# Patient Record
Sex: Male | Born: 1985 | Hispanic: Yes | Marital: Married | Smoking: Never smoker
Health system: Southern US, Community
[De-identification: ages and names within clinical notes are randomized; demographics above are authoritative.]

---

## 2016-10-16 ENCOUNTER — Emergency Department (HOSPITAL_COMMUNITY): Payer: Self-pay

## 2016-10-16 ENCOUNTER — Encounter (HOSPITAL_COMMUNITY): Payer: Self-pay

## 2016-10-16 ENCOUNTER — Emergency Department (HOSPITAL_COMMUNITY)
Admission: EM | Admit: 2016-10-16 | Discharge: 2016-10-16 | Disposition: A | Discharge status: Home / Self Care | Payer: Self-pay | Attending: Emergency Medicine | Admitting: Emergency Medicine

## 2016-10-16 DIAGNOSIS — J189 Pneumonia, unspecified organism: Secondary | ICD-10-CM | POA: Insufficient documentation

## 2016-10-16 DIAGNOSIS — R739 Hyperglycemia, unspecified: Secondary | ICD-10-CM | POA: Insufficient documentation

## 2016-10-16 DIAGNOSIS — J181 Lobar pneumonia, unspecified organism: Secondary | ICD-10-CM

## 2016-10-16 LAB — URINALYSIS, ROUTINE W REFLEX MICROSCOPIC
BILIRUBIN URINE: NEGATIVE
Bacteria, UA: NONE SEEN
GLUCOSE, UA: 50 mg/dL — AB
KETONES UR: NEGATIVE mg/dL
LEUKOCYTES UA: NEGATIVE
NITRITE: NEGATIVE
PH: 7 (ref 5.0–8.0)
PROTEIN: NEGATIVE mg/dL
SQUAMOUS EPITHELIAL / LPF: NONE SEEN
Specific Gravity, Urine: 1.01 (ref 1.005–1.030)
WBC, UA: NONE SEEN WBC/hpf (ref 0–5)

## 2016-10-16 LAB — COMPREHENSIVE METABOLIC PANEL
ALT: 31 U/L (ref 17–63)
ANION GAP: 8 (ref 5–15)
AST: 24 U/L (ref 15–41)
Albumin: 3.7 g/dL (ref 3.5–5.0)
Alkaline Phosphatase: 63 U/L (ref 38–126)
BILIRUBIN TOTAL: 1 mg/dL (ref 0.3–1.2)
BUN: 5 mg/dL — ABNORMAL LOW (ref 6–20)
CALCIUM: 8.7 mg/dL — AB (ref 8.9–10.3)
CO2: 22 mmol/L (ref 22–32)
Chloride: 104 mmol/L (ref 101–111)
Creatinine, Ser: 0.77 mg/dL (ref 0.61–1.24)
GFR calc Af Amer: >60 mL/min (ref >60)
Glucose, Bld: 150 mg/dL — ABNORMAL HIGH (ref 65–99)
POTASSIUM: 3.6 mmol/L (ref 3.5–5.1)
Sodium: 134 mmol/L — ABNORMAL LOW (ref 135–145)
TOTAL PROTEIN: 7.1 g/dL (ref 6.5–8.1)

## 2016-10-16 LAB — CBC WITH DIFFERENTIAL/PLATELET
BASOS ABS: 0 10*3/uL (ref 0.0–0.1)
BASOS PCT: 0 %
EOS PCT: 1 %
Eosinophils Absolute: 0.1 10*3/uL (ref 0.0–0.7)
HEMATOCRIT: 45.2 % (ref 39.0–52.0)
Hemoglobin: 14.9 g/dL (ref 13.0–17.0)
LYMPHS PCT: 9 %
Lymphs Abs: 1.3 10*3/uL (ref 0.7–4.0)
MCH: 28.7 pg (ref 26.0–34.0)
MCHC: 33 g/dL (ref 30.0–36.0)
MCV: 87.1 fL (ref 78.0–100.0)
Monocytes Absolute: 1.2 10*3/uL — ABNORMAL HIGH (ref 0.1–1.0)
Monocytes Relative: 9 %
NEUTROS ABS: 11.6 10*3/uL — AB (ref 1.7–7.7)
Neutrophils Relative %: 81 %
PLATELETS: 176 10*3/uL (ref 150–400)
RBC: 5.19 MIL/uL (ref 4.22–5.81)
RDW: 13.3 % (ref 11.5–15.5)
WBC: 14.2 10*3/uL — AB (ref 4.0–10.5)

## 2016-10-16 LAB — I-STAT CG4 LACTIC ACID, ED: Lactic Acid, Venous: 1.96 mmol/L — ABNORMAL HIGH (ref 0.5–1.9)

## 2016-10-16 MED ORDER — ACETAMINOPHEN 325 MG PO TABS
650.0000 mg | ORAL_TABLET | Freq: Once | ORAL | Status: AC | PRN
  Administered 2016-10-16: 650 mg via ORAL

## 2016-10-16 MED ORDER — ACETAMINOPHEN 325 MG PO TABS
ORAL_TABLET | ORAL | Status: AC
  Filled 2016-10-16: qty 2

## 2016-10-16 MED ORDER — AZITHROMYCIN 250 MG PO TABS
500.0000 mg | ORAL_TABLET | Freq: Once | ORAL | Status: AC
  Administered 2016-10-16: 500 mg via ORAL
  Filled 2016-10-16: qty 2

## 2016-10-16 MED ORDER — AZITHROMYCIN 250 MG PO TABS: 250.0000 mg | ORAL_TABLET | Freq: Every day | ORAL | 0 refills | Status: AC

## 2016-10-16 NOTE — ED Provider Notes (Signed)
[facility]-EMERGENCY DEPT Provider Note   CSN: [id number] Arrival date & time: 10/16/16  1347     History   Chief Complaint No chief complaint on file.   HPI Gerald Farmer is a 31 y.o. male.  HPI   31 year old male presents today complaining of cough and fever. He states the symptoms began 5 days prior to evaluation. He began having some fever with cough. His cough has been productive of yellow sputum. He has had some diffuse body aches and headache. His sputum was blood-tinged. He denies any dyspnea. He has had a poor appetite but has been drinking fluids without difficulty. He complains of some diffuse low back pain. He denies any urinary tract infection symptoms, specifically hematuria, frequency, or dysuria. He is not a smoker. He denies any history of pneumonia in the past. He denies any history of DVT, lateralized leg swelling, or PE. He is visiting here from [place].   History reviewed. No pertinent past medical history.  There are no active problems to display for this patient.   History reviewed. No pertinent surgical history.     Home Medications    Prior to Admission medications   Not on File    Family History No family history on file.  Social History Social History  Substance Use Topics  . Smoking status: Never Smoker  . Smokeless tobacco: Never Used  . Alcohol use Not on file     Allergies   Patient has no known allergies.   Review of Systems Review of Systems  All other systems reviewed and are negative.    Physical Exam Updated Vital Signs BP (!) 145/87 (BP Location: Right Arm)   Pulse 91   Temp 97.8 F (36.6 C) (Oral)   Resp 18   SpO2 98%   Physical Exam  Constitutional: He is oriented to person, place, and time. He appears well-developed and well-nourished. No distress.  HENT:  Head: Normocephalic and atraumatic.  Right Ear: External ear normal.  Left Ear: External ear normal.  Nose: Nose normal.  Eyes: EOM are normal.  Neck: Normal  range of motion. Neck supple. No tracheal deviation present.  Cardiovascular: Normal rate, regular rhythm and normal heart sounds.   Pulmonary/Chest: Effort normal. He has rales.  Abdominal: Soft. Bowel sounds are normal.  Musculoskeletal: Normal range of motion. He exhibits no edema, tenderness or deformity.  Neurological: He is alert and oriented to person, place, and time. He displays normal reflexes. No cranial nerve deficit. Coordination normal.  Skin: Skin is warm and dry. Capillary refill takes less than 2 seconds.  Psychiatric: He has a normal mood and affect. His behavior is normal.  Nursing note and vitals reviewed.    ED Treatments / Results  Labs (all labs ordered are listed, but only abnormal results are displayed) Labs Reviewed  COMPREHENSIVE METABOLIC PANEL - Abnormal; Notable for the following:       Result Value   Sodium 134 (*)    Glucose, Bld 150 (*)    BUN 5 (*)    Calcium 8.7 (*)    All other components within normal limits  CBC WITH DIFFERENTIAL/PLATELET - Abnormal; Notable for the following:    WBC 14.2 (*)    Neutro Abs 11.6 (*)    Monocytes Absolute 1.2 (*)    All other components within normal limits  URINALYSIS, ROUTINE W REFLEX MICROSCOPIC - Abnormal; Notable for the following:    Glucose, UA 50 (*)    Hgb urine dipstick MODERATE (*)  All other components within normal limits  I-STAT CG4 LACTIC ACID, ED - Abnormal; Notable for the following:    Lactic Acid, Venous 1.96 (*)    All other components within normal limits    EKG  EKG Interpretation None       Radiology Dg Chest 2 View  Result Date: 10/16/2016 CLINICAL DATA:  31 year old with intermittent cough, fever and low back pain that began 3 days ago. Blood-tinged sputum that began today. EXAM: CHEST  2 VIEW COMPARISON:  None. FINDINGS: Suboptimal inspiration which accentuates the cardiac silhouette. Taking this into account, cardiomediastinal silhouette unremarkable. Airspace consolidation  involving the right lower lobe. Lungs otherwise clear. No pleural effusions. Visualized bony thorax intact apart from degenerative changes at the thoracolumbar junction. IMPRESSION: Acute right lower lobe pneumonia. Electronically Signed   By: Hulan Saas M.D.   On: 10/16/2016 14:47    Procedures Procedures (including critical care time)  Medications Ordered in ED Medications  acetaminophen (TYLENOL) 325 MG tablet (not administered)  azithromycin (ZITHROMAX) tablet 500 mg (not administered)  acetaminophen (TYLENOL) tablet 650 mg (650 mg Oral Given 10/16/16 1415)     Initial Impression / Assessment and Plan / ED Course  I have reviewed the triage vital signs and the nursing notes.  Pertinent labs & imaging results that were available during my care of the patient were reviewed by me and considered in my medical decision making (see chart for details).    31 year old man presents today with cough and fever. Chest x-Adriel Desrosier here is negative for right lower lobe pneumonia. Patient is hemodynamically stable. He is not dyspneic. Plan Zithromax with first dose given here in ED. His blood sugar is 150. Is advised regarding need for follow-up, recheck of blood sugar, and return precautions and voices understanding.  Final Clinical Impressions(s) / ED Diagnoses   Final diagnoses:  Community acquired pneumonia of right lower lobe of lung (HCC)  Hyperglycemia    New Prescriptions New Prescriptions   No medications on file     Margarita Grizzle, MD 10/16/16 1626

## 2016-10-16 NOTE — ED Notes (Signed)
Pt states he was working outside in the rain Thursday and after that all his symptoms started.

## 2016-10-16 NOTE — ED Notes (Signed)
ED Provider at bedside. 

## 2016-10-16 NOTE — ED Triage Notes (Signed)
Patient complains of 3 days of fever, chills, back pain with some scrotum pain with urination. States that he also has body aches. Alert and oriented, nad

## 2018-06-18 IMAGING — CR DG CHEST 2V
2 series · 2 of 2 positions shown · non-contrast
Comparison: None.

CLINICAL DATA: 31-year-old with intermittent cough, fever and low
back pain that began 3 days ago. Blood-tinged sputum that began
today.

EXAM:
CHEST  2 VIEW

[chest pa]
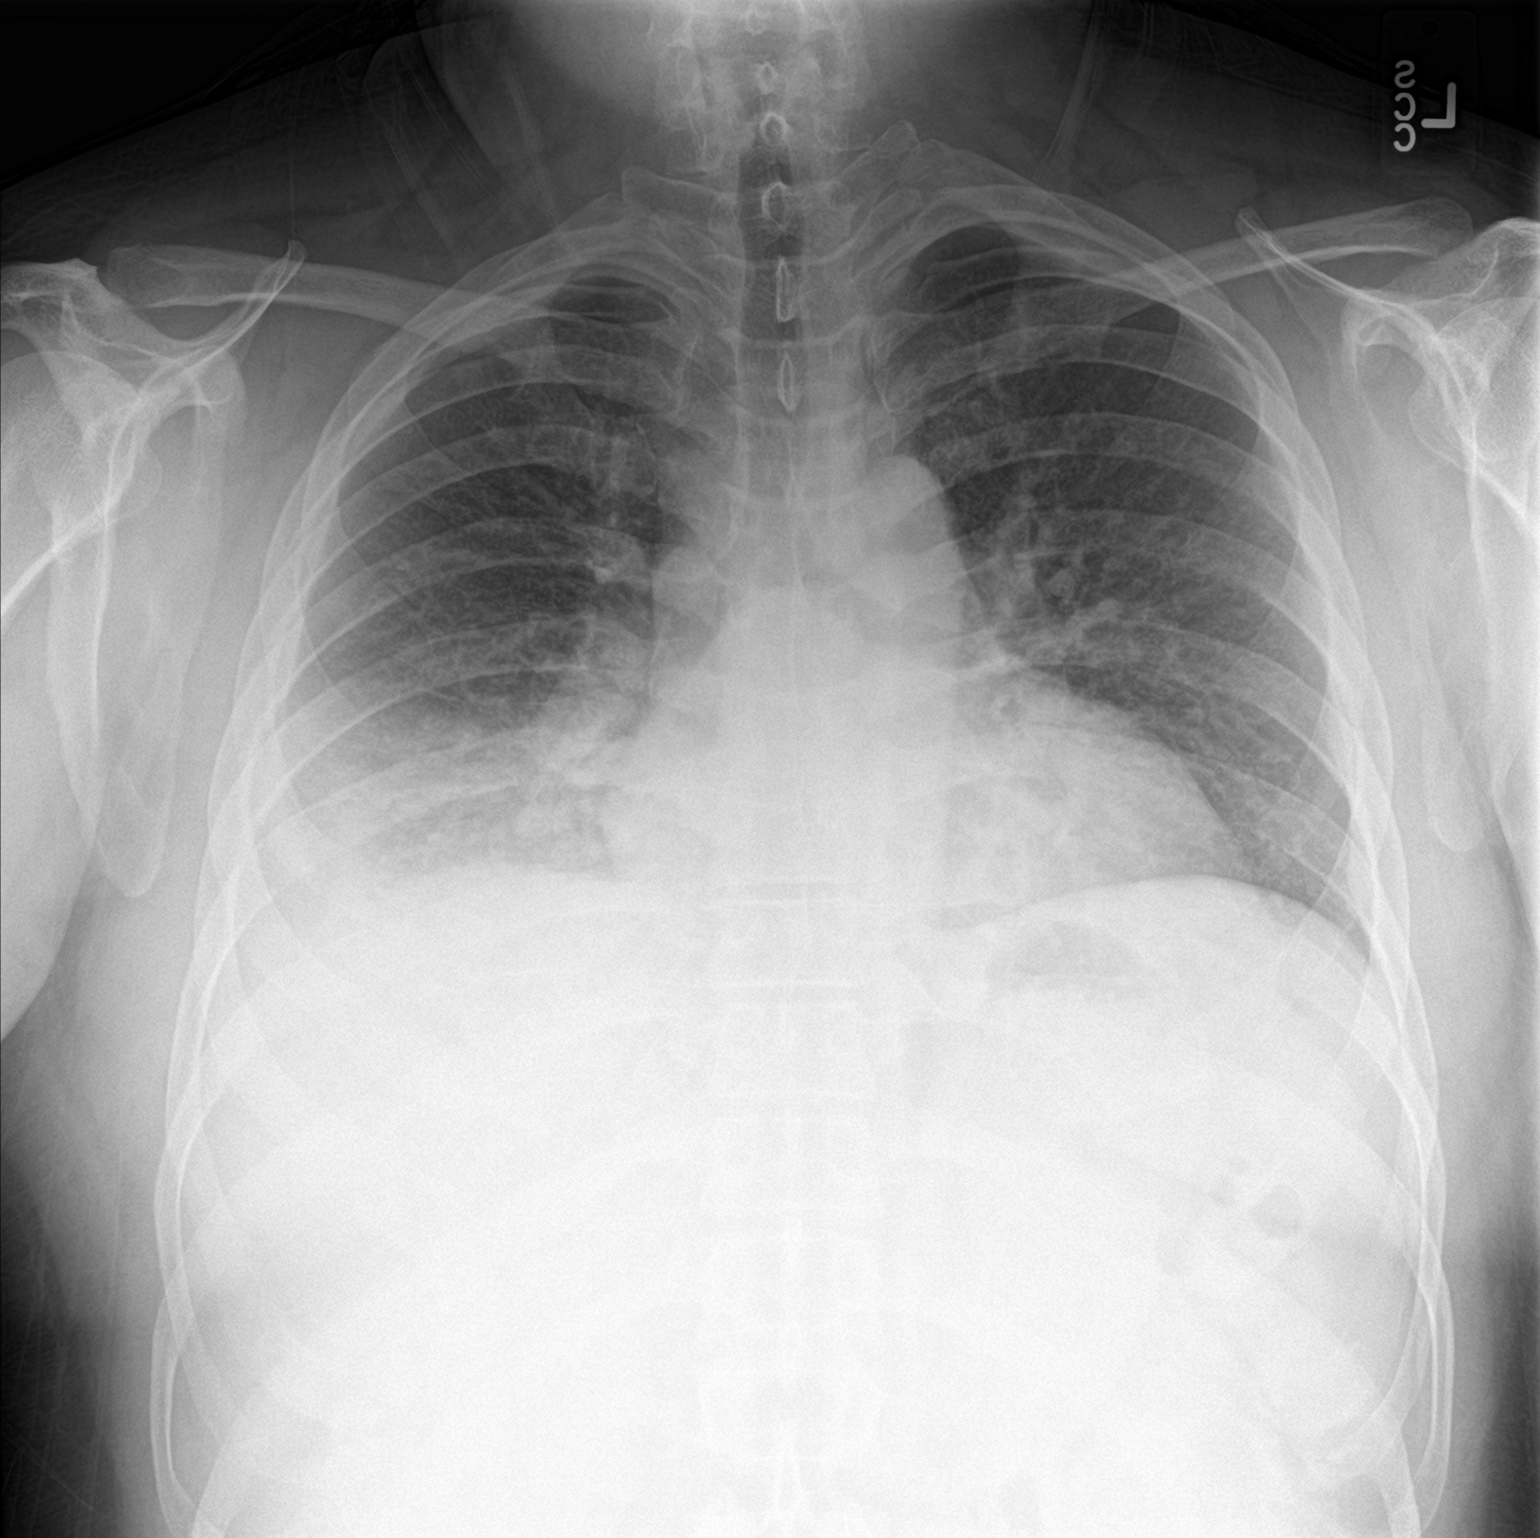

[chest lat]
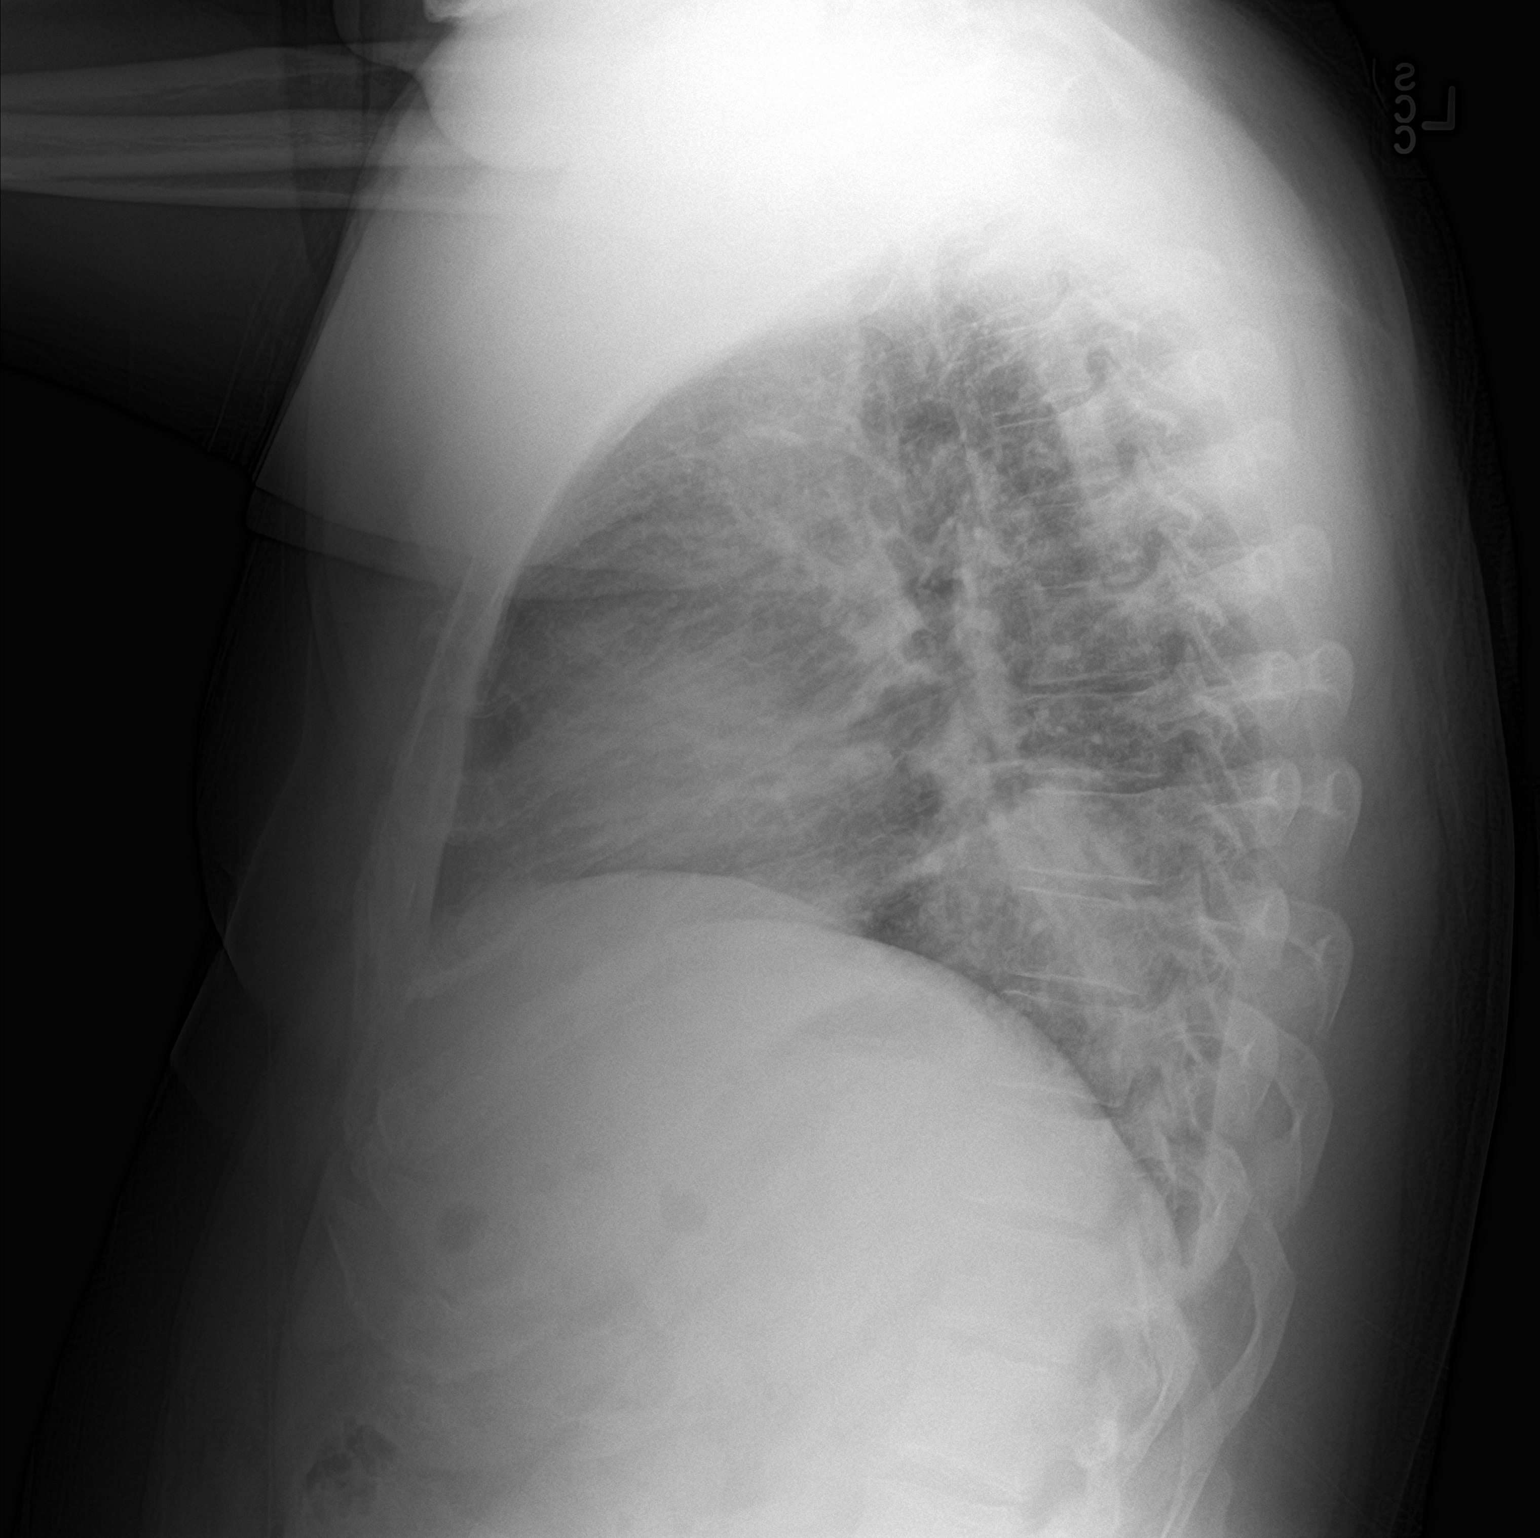

[2 of 2 positions shown; findings below may reference images not displayed]

FINDINGS: Suboptimal inspiration which accentuates the cardiac silhouette.
Taking this into account, cardiomediastinal silhouette unremarkable.
Airspace consolidation involving the right lower lobe. Lungs
otherwise clear. No pleural effusions. Visualized bony thorax intact
apart from degenerative changes at the thoracolumbar junction.
IMPRESSION: Acute right lower lobe pneumonia.
